# Patient Record
Sex: Male | Born: 1955
Health system: Southern US, Community
[De-identification: ages and names within clinical notes are randomized; demographics above are authoritative.]

## PROBLEM LIST (undated history)

## (undated) DIAGNOSIS — M1990 Unspecified osteoarthritis, unspecified site: Secondary | ICD-10-CM

## (undated) HISTORY — PX: WISDOM TOOTH EXTRACTION: SHX21

## (undated) HISTORY — PX: RETINAL LASER PROCEDURE: SHX2339

## (undated) HISTORY — PX: EYE SURGERY: SHX253

## (undated) HISTORY — PX: JOINT REPLACEMENT: SHX530

## (undated) HISTORY — DX: Unspecified osteoarthritis, unspecified site: M19.90

## (undated) HISTORY — PX: RETINAL DETACHMENT SURGERY: SHX105

## (undated) HISTORY — PX: CATARACT EXTRACTION, BILATERAL: SHX1313

---

## 2009-06-21 ENCOUNTER — Ambulatory Visit: Payer: Self-pay | Admitting: General Practice

## 2009-07-06 ENCOUNTER — Inpatient Hospital Stay: Payer: Self-pay | Admitting: General Practice

## 2010-06-22 ENCOUNTER — Ambulatory Visit: Payer: Self-pay | Admitting: General Practice

## 2010-06-29 ENCOUNTER — Inpatient Hospital Stay: Payer: Self-pay | Admitting: General Practice

## 2010-07-01 LAB — PATHOLOGY REPORT

## 2010-08-07 HISTORY — PX: TOTAL HIP ARTHROPLASTY: SHX124

## 2011-08-08 HISTORY — PX: TOTAL HIP ARTHROPLASTY: SHX124

## 2012-06-06 IMAGING — CR RIGHT HIP - COMPLETE 2+ VIEW
1 series · 2 of 2 positions shown · non-contrast
Comparison: none

REASON FOR EXAM: s/p THA
COMMENTS:   Bedside (portable):Y

PROCEDURE:     DXR - DXR HIP RIGHT COMPLETE  - June 29, 2010 [DATE]
RESULT:     The patient is status post right hip replacement. No fracture
about the prosthetic components is seen. There is no dislocation at the
prosthetic hip joint.

[Series 1: view not recorded · 0.17mm/px · 2 of 2 slices shown]
[im 1/2]
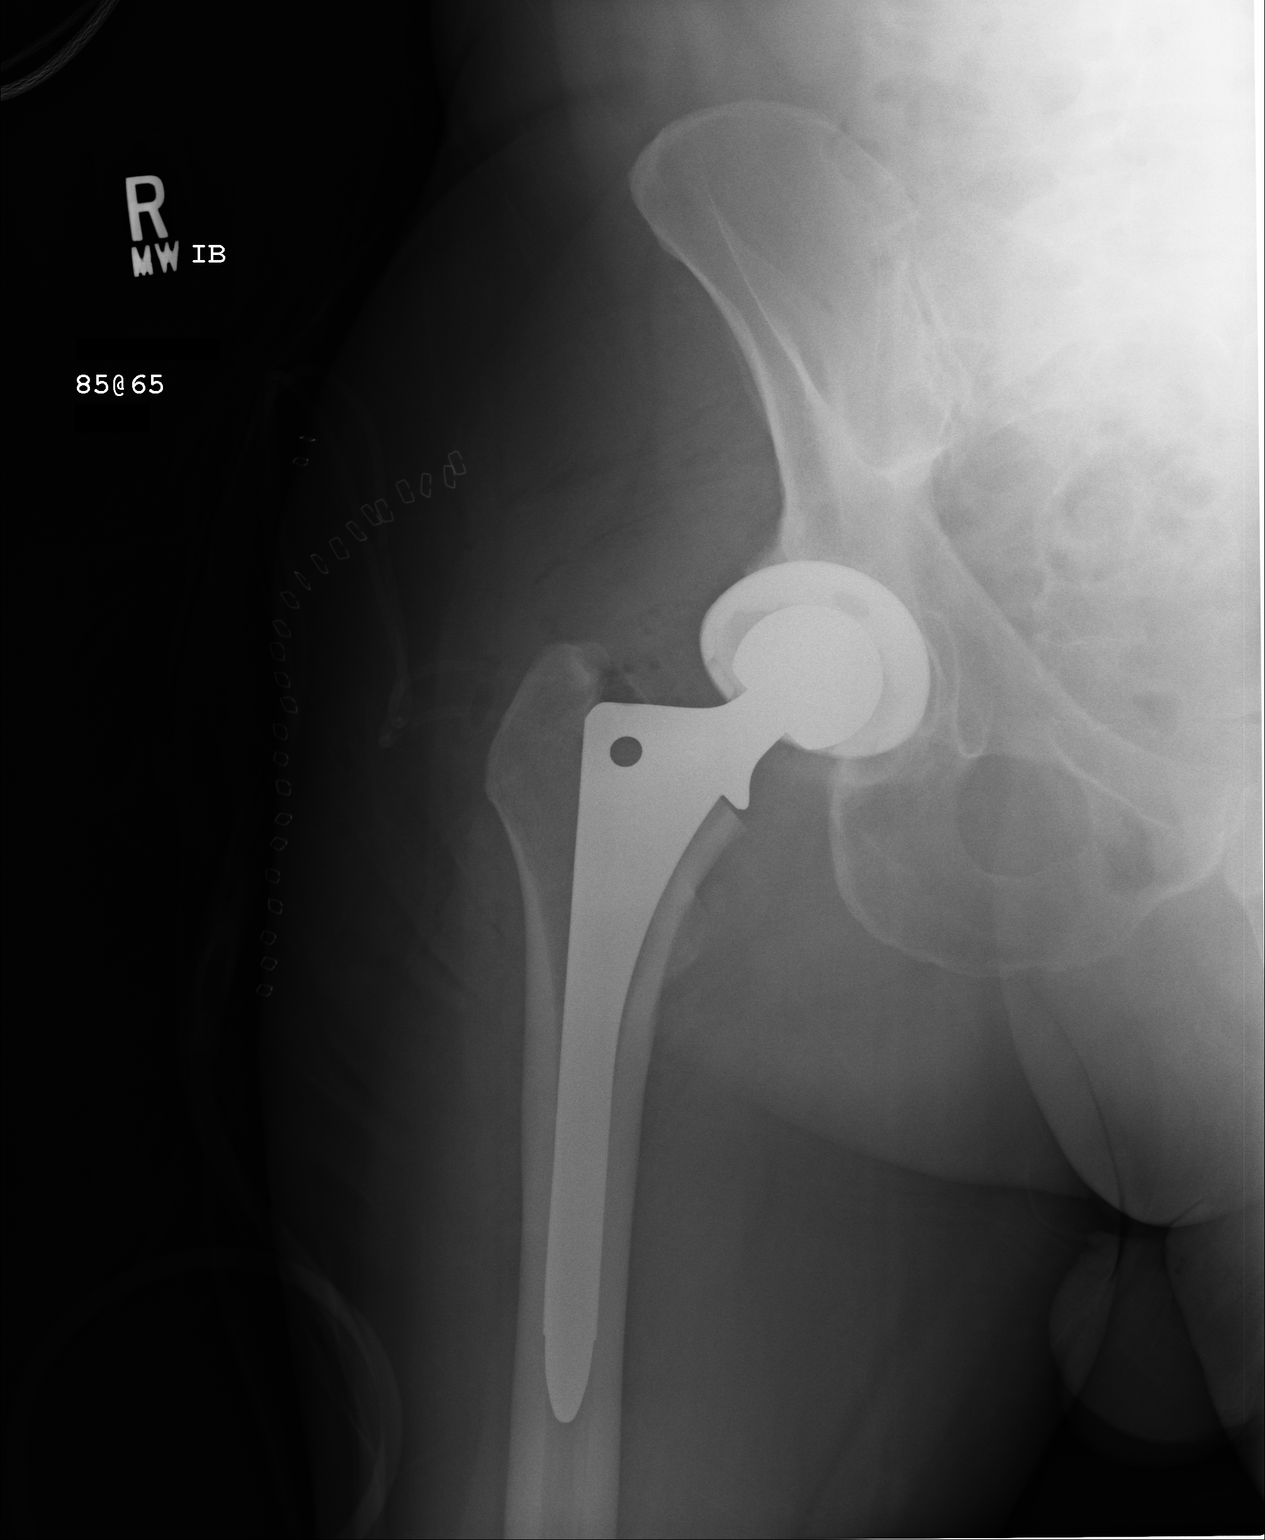
[im 2/2]
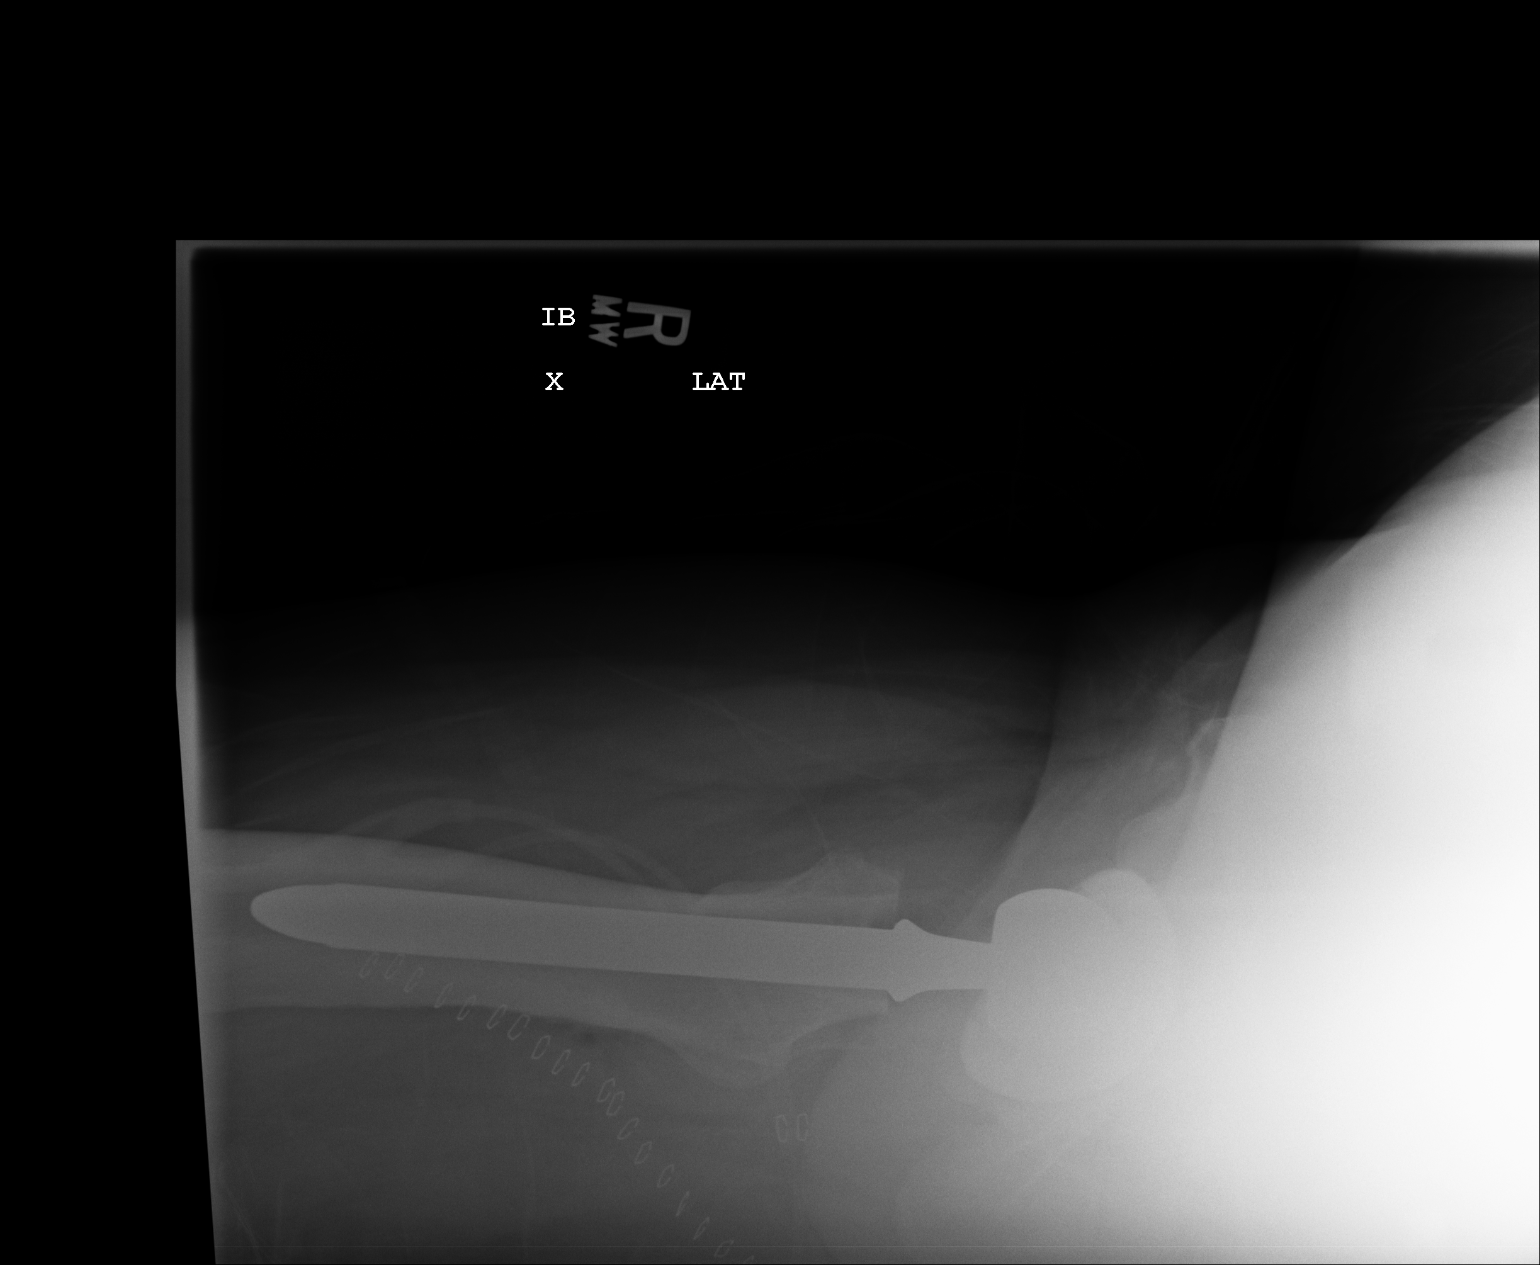

[2 of 2 positions shown; findings below may reference images not displayed]

IMPRESSION: 1.     The patient is status post right hip replacement. No abnormal
postoperative changes are identified.

## 2016-08-15 ENCOUNTER — Ambulatory Visit (INDEPENDENT_AMBULATORY_CARE_PROVIDER_SITE_OTHER): Payer: Managed Care, Other (non HMO) | Admitting: Primary Care

## 2016-08-15 ENCOUNTER — Encounter: Payer: Self-pay | Admitting: Primary Care

## 2016-08-15 VITALS — BP 122/84 | HR 83 | Temp 99.1°F | Ht 71.0 in | Wt 203.0 lb

## 2016-08-15 DIAGNOSIS — J069 Acute upper respiratory infection, unspecified: Secondary | ICD-10-CM

## 2016-08-15 DIAGNOSIS — Z7689 Persons encountering health services in other specified circumstances: Secondary | ICD-10-CM | POA: Insufficient documentation

## 2016-08-15 DIAGNOSIS — Z0000 Encounter for general adult medical examination without abnormal findings: Secondary | ICD-10-CM | POA: Insufficient documentation

## 2016-08-15 DIAGNOSIS — M1990 Unspecified osteoarthritis, unspecified site: Secondary | ICD-10-CM | POA: Diagnosis not present

## 2016-08-15 MED ORDER — AZITHROMYCIN 250 MG PO TABS: ORAL_TABLET | ORAL | 0 refills | Status: DC

## 2016-08-15 NOTE — Progress Notes (Signed)
Subjective:    Patient ID: Mark Walters, male    DOB: 1956-08-03, 61 y.o.   MRN: [id number]  URI   Associated symptoms include congestion and coughing. Pertinent negatives include no chest pain or sore throat.    Mark Walters is a 61 year old male who presents today to establish care and discuss the problems mentioned below. Will obtain old records.  1) Cough: Also with chest congestion, nasal congestion, headache. His symptoms began 9 days ago. He's taken Mucinex, Theraflu, Robitussin for his symptoms without much improvement. Overall he started feeling worse towards the end of last week. His cough is productive with green/yellow/dark sputum.    Review of Systems  Constitutional: Positive for fatigue and fever. Negative for chills.  HENT: Positive for congestion and sinus pressure. Negative for sore throat.   Respiratory: Positive for cough. Negative for shortness of breath.   Cardiovascular: Negative for chest pain.       Past Medical History:  Diagnosis Date  . Arthritis      Social History   Social History  . Marital status: Married    Spouse name: N/A  . Number of children: N/A  . Years of education: N/A   Occupational History  . Not on file.   Social History Main Topics  . Smoking status: Former Games developermoker  . Smokeless tobacco: Former NeurosurgeonUser  . Alcohol use Not on file  . Drug use: Unknown  . Sexual activity: Not on file   Other Topics Concern  . Not on file   Social History Narrative   Married.   2 children. 1 grandchild.   Works in [employer] as a Production designer, theatre/television/filmmanager.    Enjoys playing chess, walking his dog.    Past Surgical History:  Procedure Laterality Date  . TOTAL HIP ARTHROPLASTY Left 2012  . TOTAL HIP ARTHROPLASTY Right 2013    Family History  Problem Relation Age of Onset  . Heart disease Paternal Grandfather   . Alzheimer's disease Mother     No Known Allergies  No current outpatient prescriptions on file prior to visit.   No current  facility-administered medications on file prior to visit.     BP 122/84 (BP Location: Left Arm, Patient Position: Sitting, Cuff Size: Normal)   Pulse 83   Temp 99.1 F (37.3 C) (Oral)   Ht 5\' 11"  (1.803 m)   Wt 203 lb (92.1 kg)   SpO2 96%   BMI 28.31 kg/m    Objective:   Physical Exam  Constitutional: He appears well-nourished. He appears ill.  HENT:  Right Ear: Tympanic membrane and ear canal normal.  Left Ear: Ear canal normal. Tympanic membrane is bulging. Tympanic membrane is not erythematous.  Nose: Mucosal edema present. Right sinus exhibits no maxillary sinus tenderness and no frontal sinus tenderness. Left sinus exhibits no maxillary sinus tenderness and no frontal sinus tenderness.  Mouth/Throat: Oropharynx is clear and moist.  Eyes: Conjunctivae are normal.  Neck: Neck supple.  Cardiovascular: Normal rate and regular rhythm.   Pulmonary/Chest: Effort normal and breath sounds normal. He has no wheezes. He has no rales.  Hacking cough present during exam  Skin: Skin is warm and dry.          Assessment & Plan:  URI:  Cough, congestion, headache x 9 days. Overall starting to feel worse and now with productive cough. Exam today with hacking cough, does appear ill, clear lungs. Given duration of symptoms, examination, and presentation will treat. Rx for Zpak sent  to pharmacy. Discussed to continue mucinex, stay hydrated and rest. Follow up PRN.  Establish Care:  No significant PMH. Has not seen PCP in years. Will schedule physical in 2018.  Morrie Sheldon, NP

## 2016-08-15 NOTE — Patient Instructions (Signed)
Start Azithromycin antibiotics. Take 2 tablets by mouth today, then 1 tablet daily for 4 additional days.  Continue Mucinex and ensure you are staying hydrated with water.  Please schedule a physical with me within the next 6 months. You may also schedule a lab only appointment 3-4 days prior. We will discuss your lab results in detail during your physical.  It was a pleasure to meet you today! Please don't hesitate to call me with any questions. Welcome to [facility]!

## 2016-08-15 NOTE — Assessment & Plan Note (Signed)
To hips bilaterally, history of bilateral replacements. Overall manages well with OTC medications which he uses infrequently.

## 2016-08-15 NOTE — Progress Notes (Signed)
Pre visit review using our clinic review tool, if applicable. No additional management support is needed unless otherwise documented below in the visit note. 

## 2017-06-15 ENCOUNTER — Ambulatory Visit (INDEPENDENT_AMBULATORY_CARE_PROVIDER_SITE_OTHER): Payer: BLUE CROSS/BLUE SHIELD | Admitting: Internal Medicine

## 2017-06-15 ENCOUNTER — Encounter: Payer: Self-pay | Admitting: Internal Medicine

## 2017-06-15 VITALS — BP 110/74 | HR 61 | Temp 98.6°F | Wt 210.0 lb

## 2017-06-15 DIAGNOSIS — J0100 Acute maxillary sinusitis, unspecified: Secondary | ICD-10-CM | POA: Insufficient documentation

## 2017-06-15 MED ORDER — AMOXICILLIN 500 MG PO TABS: 1000.0000 mg | ORAL_TABLET | Freq: Two times a day (BID) | ORAL | 0 refills | Status: AC

## 2017-06-15 NOTE — Assessment & Plan Note (Signed)
Has been sick for 2 weeks Feels it in chest but nothing to suggest pulmonary infection Discussed analgesics. Consider humidifier Will treat with amoxil

## 2017-06-15 NOTE — Progress Notes (Signed)
   Subjective:    Patient ID: Mark Walters, male    DOB: March 31, 1956, 61 y.o.   MRN: [id number]  HPI Here due to respiratory infection  Started with head cold a couple of weeks ago Now into his chest for the past 10 days or so Sore throat last week No fever No chills or sweats No SOB  Various cold pills Not clear if helping Also uses chloraseptic  No current outpatient medications on file prior to visit.   No current facility-administered medications on file prior to visit.     No Known Allergies  Past Medical History:  Diagnosis Date  . Arthritis     Past Surgical History:  Procedure Laterality Date  . TOTAL HIP ARTHROPLASTY Left 2012  . TOTAL HIP ARTHROPLASTY Right 2013    Family History  Problem Relation Age of Onset  . Heart disease Paternal Grandfather   . Alzheimer's disease Mother     Social History   Socioeconomic History  . Marital status: Married    Spouse name: Not on file  . Number of children: Not on file  . Years of education: Not on file  . Highest education level: Not on file  Social Needs  . Financial resource strain: Not on file  . Food insecurity - worry: Not on file  . Food insecurity - inability: Not on file  . Transportation needs - medical: Not on file  . Transportation needs - non-medical: Not on file  Occupational History  . Not on file  Tobacco Use  . Smoking status: Former Games developermoker  . Smokeless tobacco: Former Engineer, waterUser  Substance and Sexual Activity  . Alcohol use: Not on file  . Drug use: Not on file  . Sexual activity: Not on file  Other Topics Concern  . Not on file  Social History Narrative   Married.   2 children. 1 grandchild.   Works in [employer] as a Production designer, theatre/television/filmmanager.    Enjoys playing chess, walking his dog.   Review of Systems Some headache around eyes--especially with cough (worse at night) Some left ear pain at the beginning --gone now No rash No vomiting Loose stools at first--not bad. Not more frequent Appetite is  good    Objective:   Physical Exam  Constitutional: No distress.  HENT:  Mouth/Throat: Oropharynx is clear and moist. No oropharyngeal exudate.  Mild maxillary tenderness Moderate nasal inflammation, esp on left TMs normal  Neck: No thyromegaly present.  Pulmonary/Chest: Effort normal and breath sounds normal. No respiratory distress. He has no wheezes. He has no rales.  Musculoskeletal: He exhibits no edema.  Lymphadenopathy:    He has no cervical adenopathy.          Assessment & Plan:

## 2019-11-06 ENCOUNTER — Ambulatory Visit: Payer: BLUE CROSS/BLUE SHIELD

## 2020-08-16 DIAGNOSIS — H2512 Age-related nuclear cataract, left eye: Secondary | ICD-10-CM | POA: Diagnosis not present

## 2020-08-17 DIAGNOSIS — H2511 Age-related nuclear cataract, right eye: Secondary | ICD-10-CM | POA: Diagnosis not present

## 2020-09-06 DIAGNOSIS — H2511 Age-related nuclear cataract, right eye: Secondary | ICD-10-CM | POA: Diagnosis not present

## 2020-09-07 ENCOUNTER — Ambulatory Visit: Payer: Self-pay | Admitting: Internal Medicine

## 2020-09-22 ENCOUNTER — Other Ambulatory Visit: Payer: Self-pay

## 2020-09-23 ENCOUNTER — Other Ambulatory Visit: Payer: Self-pay

## 2020-09-23 ENCOUNTER — Ambulatory Visit (INDEPENDENT_AMBULATORY_CARE_PROVIDER_SITE_OTHER): Payer: Medicare Other | Admitting: Internal Medicine

## 2020-09-23 ENCOUNTER — Encounter: Payer: Self-pay | Admitting: Internal Medicine

## 2020-09-23 VITALS — BP 116/70 | HR 67 | Temp 98.2°F | Wt 198.0 lb

## 2020-09-23 DIAGNOSIS — M1990 Unspecified osteoarthritis, unspecified site: Secondary | ICD-10-CM

## 2020-09-23 DIAGNOSIS — Z1159 Encounter for screening for other viral diseases: Secondary | ICD-10-CM

## 2020-09-23 DIAGNOSIS — Z0000 Encounter for general adult medical examination without abnormal findings: Secondary | ICD-10-CM

## 2020-09-23 DIAGNOSIS — Z125 Encounter for screening for malignant neoplasm of prostate: Secondary | ICD-10-CM | POA: Diagnosis not present

## 2020-09-23 DIAGNOSIS — Z114 Encounter for screening for human immunodeficiency virus [HIV]: Secondary | ICD-10-CM | POA: Diagnosis not present

## 2020-09-23 DIAGNOSIS — Z1211 Encounter for screening for malignant neoplasm of colon: Secondary | ICD-10-CM

## 2020-09-23 NOTE — Progress Notes (Signed)
HPI  Pt presents to the clinic today to establish care and for management of the conditions listed below. He would also like his Welcome to Medicare Exam today.  OA: Mainly in his hips s/p b/l hip replacements, knees. He takes Ibuprofen as needed with some relief.   Past Medical History:  Diagnosis Date  . Arthritis     Current Outpatient Medications  Medication Sig Dispense Refill  . Ascorbic Acid (VITAMIN C) 1000 MG tablet Take 1,000 mg by mouth daily.    Marland Kitchen BESIVANCE 0.6 % SUSP Place 1 drop into the right eye 3 (three) times daily.    . brimonidine (ALPHAGAN P) 0.1 % SOLN     . Cholecalciferol (VITAMIN D3) 50 MCG (2000 UT) TABS Take by mouth.    . Difluprednate 0.05 % EMUL INSTILL 1 DROP IN RIGHT EYE THREE TIMES DAILY AS DIRECTED    . ibuprofen (ADVIL) 200 MG tablet Take 200 mg by mouth every 6 (six) hours as needed.    Marland Kitchen PROLENSA 0.07 % SOLN SMARTSIG:1 Drop(s) Right Eye Every Evening     No current facility-administered medications for this visit.    No Known Allergies  Family History  Problem Relation Age of Onset  . Heart disease Paternal Grandfather   . Hyperlipidemia Paternal Grandfather   . Hypertension Paternal Grandfather   . Alzheimer's disease Mother   . Asthma Father   . Asthma Sister     Social History   Socioeconomic History  . Marital status: Married    Spouse name: Not on file  . Number of children: Not on file  . Years of education: Not on file  . Highest education level: Not on file  Occupational History  . Not on file  Tobacco Use  . Smoking status: Former Smoker    Types: Cigars  . Smokeless tobacco: Former Network engineer and Sexual Activity  . Alcohol use: Yes    Comment: occasional, EOD  . Drug use: Never  . Sexual activity: Not on file  Other Topics Concern  . Not on file  Social History Narrative   Married.   2 children. 1 grandchild.   Works in [employer] as a Freight forwarder.    Enjoys playing chess, walking his dog.   Social Determinants  of Health   Financial Resource Strain: Not on file  Food Insecurity: Not on file  Transportation Needs: Not on file  Physical Activity: Not on file  Stress: Not on file  Social Connections: Not on file  Intimate Partner Violence: Not on file    Hospitiliaztions: None  Health Maintenance:    Flu: Never  Tetanus: >10 years ago  Covid: J&J, Pfizer x1  PSA Screening: Never   Colon Screening: Never  Vision Screening: Annually  Dentist: Biannually   Providers:   KJZ:PHXTAV Spencer Cardinal, NP   I have personally reviewed and have noted:  1. The patient's medical and social history 2. Their use of alcohol, tobacco or illicit drugs 3. Their current medications and supplements 4. The patient's functional ability including ADL's, fall risks, home safety risks and hearing or visual impairment. 5. Diet and physical activities 6. Evidence for depression or mood disorder  Subjective:   Review of Systems:   Constitutional: Denies fever, malaise, fatigue, headache or abrupt weight changes.  HEENT: Denies eye pain, eye redness, ear pain, ringing in the ears, wax buildup, runny nose, nasal congestion, bloody nose, or sore throat. Respiratory: Denies difficulty breathing, shortness of breath, cough or sputum production.  Cardiovascular: Denies chest pain, chest tightness, palpitations or swelling in the hands or feet.  Gastrointestinal: Denies abdominal pain, bloating, constipation, diarrhea or blood in the stool.  GU: Denies urgency, frequency, pain with urination, burning sensation, blood in urine, odor or discharge. Musculoskeletal: Patient reports intermittent knee pain.  Denies decrease in range of motion, difficulty with gait, muscle pain or joint swelling.  Skin: Denies redness, rashes, lesions or ulcercations.  Neurological: Denies dizziness, difficulty with memory, difficulty with speech or problems with balance and coordination.  Psych: Denies anxiety, depression, SI/HI.  No other  specific complaints in a complete review of systems (except as listed in HPI above).  Objective:  PE:   BP 116/70   Pulse 67   Temp 98.2 F (36.8 C) (Temporal)   Wt 89.8 kg   SpO2 97%   BMI 27.62 kg/m  Wt Readings from Last 3 Encounters:  09/23/20 89.8 kg  06/15/17 95.3 kg  08/15/16 92.1 kg    General: Appears his stated age, well developed, well nourished in NAD. Skin: Warm, dry and intact. No rashes noted. HEENT: Head: normal shape and size; Eyes: sclera white, no icterus, conjunctiva pink, PERRLA and EOMs intact;  Neck: Neck supple, trachea midline. No masses, lumps or thyromegaly present.  Cardiovascular: Normal rate and rhythm. S1,S2 noted.  No murmur, rubs or gallops noted. No JVD or BLE edema. No carotid bruits noted. Pulmonary/Chest: Normal effort and positive vesicular breath sounds. No respiratory distress. No wheezes, rales or ronchi noted.  Abdomen: Soft and nontender. Normal bowel sounds. No distention or masses noted. Liver, spleen and kidneys non palpable. Musculoskeletal: Strength 5/5 BUE/BLE. No signs of joint swelling.  Neurological: Alert and oriented. Cranial nerves II-XII grossly intact. Coordination normal.  Psychiatric: Mood and affect normal. Behavior is normal. Judgment and thought content normal.   ECG: incomplete right bundle branch block  BMET    Component Value Date/Time   NA 137 09/23/2020 1520   K 4.5 09/23/2020 1520   CL 103 09/23/2020 1520   CO2 29 09/23/2020 1520   GLUCOSE 97 09/23/2020 1520   BUN 17 09/23/2020 1520   CREATININE 1.02 09/23/2020 1520   CALCIUM 9.2 09/23/2020 1520    Lipid Panel     Component Value Date/Time   CHOL 193 09/23/2020 1520   TRIG 89.0 09/23/2020 1520   HDL 75.30 09/23/2020 1520   CHOLHDL 3 09/23/2020 1520   VLDL 17.8 09/23/2020 1520   LDLCALC 100 (H) 09/23/2020 1520    CBC    Component Value Date/Time   WBC 8.1 09/23/2020 1520   RBC 4.25 09/23/2020 1520   HGB 13.8 09/23/2020 1520   HCT 40.9  09/23/2020 1520   PLT 245.0 09/23/2020 1520   MCV 96.2 09/23/2020 1520   MCHC 33.8 09/23/2020 1520   RDW 13.0 09/23/2020 1520    Hgb A1C No results found for: HGBA1C    Assessment and Plan:   Medicare Annual Wellness Visit:  Diet: He does eat meat.  He consumes fruits and vegetables.  He does eat fried foods Physical activity: Walking Depression/mood screen: Negative, PHQ 9 score of 0 Hearing: Intact to whispered voice Visual acuity: Grossly normal, performs annual eye exam  ADLs: Capable Fall risk: None Home safety: Good Cognitive evaluation: Intact to orientation, naming, recall and repetition EOL planning: No adv directives, full code/ I agree  Preventative Medicine: He declines flu, tetanus, Pneumovax, Prevnar today.  Encouraged him to get his COVID booster.  Zostavax UTD but he declines Shingrix at  this time.  PSA today with labs.  He declines referral to GI for colonoscopy but Cologuard ordered.  Encouraged him to consume a balanced diet and exercise regimen.  Advised him to see an eye doctor and dentist annually.  Will check CBC, c-Met, lipid, PSA, HIV and hep C today.  Due dates for screening exam given the patient as part of his AVS.   Next appointment: 1 year Medicare wellness exam.    Webb Silversmith, NP This visit occurred during the SARS-CoV-2 public health emergency.  Safety protocols were in place, including screening questions prior to the visit, additional usage of staff PPE, and extensive cleaning of exam room while observing appropriate contact time as indicated for disinfecting solutions.

## 2020-09-24 LAB — LIPID PANEL
Cholesterol: 193 mg/dL (ref 0–200)
HDL: 75.3 mg/dL (ref >39.00)
LDL Cholesterol: 100 mg/dL — ABNORMAL HIGH (ref 0–99)
NonHDL: 118.1
Total CHOL/HDL Ratio: 3
Triglycerides: 89 mg/dL (ref 0.0–149.0)
VLDL: 17.8 mg/dL (ref 0.0–40.0)

## 2020-09-24 LAB — HEPATITIS C ANTIBODY
Hepatitis C Ab: NONREACTIVE
SIGNAL TO CUT-OFF: 0.02 (ref <1.00)

## 2020-09-24 LAB — PSA, MEDICARE: PSA: 2.11 ng/ml (ref 0.10–4.00)

## 2020-09-24 LAB — COMPREHENSIVE METABOLIC PANEL
ALT: 23 U/L (ref 0–53)
AST: 19 U/L (ref 0–37)
Albumin: 4.1 g/dL (ref 3.5–5.2)
Alkaline Phosphatase: 39 U/L (ref 39–117)
BUN: 17 mg/dL (ref 6–23)
CO2: 29 mEq/L (ref 19–32)
Calcium: 9.2 mg/dL (ref 8.4–10.5)
Chloride: 103 mEq/L (ref 96–112)
Creatinine, Ser: 1.02 mg/dL (ref 0.40–1.50)
GFR: 77.37 mL/min (ref >60.00)
Glucose, Bld: 97 mg/dL (ref 70–99)
Potassium: 4.5 mEq/L (ref 3.5–5.1)
Sodium: 137 mEq/L (ref 135–145)
Total Bilirubin: 0.5 mg/dL (ref 0.2–1.2)
Total Protein: 6.5 g/dL (ref 6.0–8.3)

## 2020-09-24 LAB — CBC
HCT: 40.9 % (ref 39.0–52.0)
Hemoglobin: 13.8 g/dL (ref 13.0–17.0)
MCHC: 33.8 g/dL (ref 30.0–36.0)
MCV: 96.2 fl (ref 78.0–100.0)
Platelets: 245 10*3/uL (ref 150.0–400.0)
RBC: 4.25 Mil/uL (ref 4.22–5.81)
RDW: 13 % (ref 11.5–15.5)
WBC: 8.1 10*3/uL (ref 4.0–10.5)

## 2020-09-24 LAB — HIV ANTIBODY (ROUTINE TESTING W REFLEX): HIV 1&2 Ab, 4th Generation: NONREACTIVE

## 2020-09-27 NOTE — Patient Instructions (Signed)

## 2020-09-27 NOTE — Assessment & Plan Note (Signed)
Mainly in hips and knees Continue Ibuprofen OTC

## 2020-10-25 DIAGNOSIS — Z1211 Encounter for screening for malignant neoplasm of colon: Secondary | ICD-10-CM | POA: Diagnosis not present

## 2020-10-29 LAB — EXTERNAL GENERIC LAB PROCEDURE: COLOGUARD: NEGATIVE

## 2020-10-29 LAB — COLOGUARD: COLOGUARD: NEGATIVE

## 2020-11-01 ENCOUNTER — Encounter: Payer: Self-pay | Admitting: Internal Medicine

## 2020-11-01 ENCOUNTER — Ambulatory Visit (INDEPENDENT_AMBULATORY_CARE_PROVIDER_SITE_OTHER): Payer: Medicare Other | Admitting: Internal Medicine

## 2020-11-01 ENCOUNTER — Other Ambulatory Visit: Payer: Self-pay

## 2020-11-01 VITALS — BP 118/76 | HR 63 | Temp 97.5°F | Wt 198.0 lb

## 2020-11-01 DIAGNOSIS — L02212 Cutaneous abscess of back [any part, except buttock]: Secondary | ICD-10-CM

## 2020-11-01 MED ORDER — SULFAMETHOXAZOLE-TRIMETHOPRIM 800-160 MG PO TABS: 1.0000 | ORAL_TABLET | Freq: Two times a day (BID) | ORAL | 0 refills | Status: DC

## 2020-11-01 NOTE — Progress Notes (Signed)
Subjective:    Patient ID: Mark Walters, male    DOB: September 16, 1955, 65 y.o.   MRN: [id number]  HPI  Pt presents to the clinic today with c/o swelling and discharge from a lump on his upper back. He noticed this about 1 week ago. He reports the swelling has decreased but he still has discharge. He has washed the area with peroxide and took 4 Amoxicillin tablets that he has to take prior to dental procedures. He denies fever, chills, nausea or vomiting.  Review of Systems      Past Medical History:  Diagnosis Date  . Arthritis     Current Outpatient Medications  Medication Sig Dispense Refill  . Ascorbic Acid (VITAMIN C) 1000 MG tablet Take 1,000 mg by mouth daily.    Marland Kitchen BESIVANCE 0.6 % SUSP Place 1 drop into the right eye 3 (three) times daily.    . brimonidine (ALPHAGAN P) 0.1 % SOLN     . Cholecalciferol (VITAMIN D3) 50 MCG (2000 UT) TABS Take by mouth.    . Difluprednate 0.05 % EMUL INSTILL 1 DROP IN RIGHT EYE THREE TIMES DAILY AS DIRECTED    . ibuprofen (ADVIL) 200 MG tablet Take 200 mg by mouth every 6 (six) hours as needed.    Marland Kitchen PROLENSA 0.07 % SOLN SMARTSIG:1 Drop(s) Right Eye Every Evening     No current facility-administered medications for this visit.    No Known Allergies  Family History  Problem Relation Age of Onset  . Heart disease Paternal Grandfather   . Hyperlipidemia Paternal Grandfather   . Hypertension Paternal Grandfather   . Alzheimer's disease Mother   . Asthma Father   . Asthma Sister     Social History   Socioeconomic History  . Marital status: Married    Spouse name: Not on file  . Number of children: Not on file  . Years of education: Not on file  . Highest education level: Not on file  Occupational History  . Not on file  Tobacco Use  . Smoking status: Former Smoker    Types: Cigars  . Smokeless tobacco: Former Engineer, water and Sexual Activity  . Alcohol use: Yes    Comment: occasional, EOD  . Drug use: Never  . Sexual  activity: Not on file  Other Topics Concern  . Not on file  Social History Narrative   Married.   2 children. 1 grandchild.   Works in [employer] as a Production designer, theatre/television/film.    Enjoys playing chess, walking his dog.   Social Determinants of Health   Financial Resource Strain: Not on file  Food Insecurity: Not on file  Transportation Needs: Not on file  Physical Activity: Not on file  Stress: Not on file  Social Connections: Not on file  Intimate Partner Violence: Not on file     Constitutional: Denies fever, malaise, fatigue, headache or abrupt weight changes.  Respiratory: Denies difficulty breathing, shortness of breath, cough or sputum production.   Cardiovascular: Denies chest pain, chest tightness, palpitations or swelling in the hands or feet.   Skin: Pt reports abscess of back. Denies redness, rashes or ulcercations.   No other specific complaints in a complete review of systems (except as listed in HPI above).  Objective:   Physical Exam  BP 118/76   Pulse 63   Temp (!) 97.5 F (36.4 C) (Temporal)   Wt 198 lb (89.8 kg)   SpO2 98%   BMI 27.62 kg/m    Wt  Readings from Last 3 Encounters:  09/23/20 198 lb (89.8 kg)  06/15/17 210 lb (95.3 kg)  08/15/16 203 lb (92.1 kg)    General: Appears his stated age, well developed, well nourished in NAD. Skin: 2 cm abscess noted of central upper back, in between the shoulder blades. No surrounding cellulitis. Cardiovascular: Normal rate. Pulmonary/Chest: Normal effort. Neurological: Alert and oriented.   BMET    Component Value Date/Time   NA 137 09/23/2020 1520   K 4.5 09/23/2020 1520   CL 103 09/23/2020 1520   CO2 29 09/23/2020 1520   GLUCOSE 97 09/23/2020 1520   BUN 17 09/23/2020 1520   CREATININE 1.02 09/23/2020 1520   CALCIUM 9.2 09/23/2020 1520    Lipid Panel     Component Value Date/Time   CHOL 193 09/23/2020 1520   TRIG 89.0 09/23/2020 1520   HDL 75.30 09/23/2020 1520   CHOLHDL 3 09/23/2020 1520   VLDL 17.8  09/23/2020 1520   LDLCALC 100 (H) 09/23/2020 1520    CBC    Component Value Date/Time   WBC 8.1 09/23/2020 1520   RBC 4.25 09/23/2020 1520   HGB 13.8 09/23/2020 1520   HCT 40.9 09/23/2020 1520   PLT 245.0 09/23/2020 1520   MCV 96.2 09/23/2020 1520   MCHC 33.8 09/23/2020 1520   RDW 13.0 09/23/2020 1520    Hgb A1C No results found for: HGBA1C        Assessment & Plan:   Abscess of Back:  I&D performed, see procedure noted RX for Septra DS 1 tab PO BID x 10 days  Procedure Note:  Discussed risk and benefits of procedure Informed consent obtained verbally Area cleansed with Betadine x 3 Area numbnes with 1% Lidocaine with Epi Area incised with #11 blade Small amount of thick, chunky white matter expressed Area cleanses with NS Covered with TAB and bandaid Pt tolerated well, no complications  RTC in 5 days for recheck Nicki Reaper, NP This visit occurred during the SARS-CoV-2 public health emergency.  Safety protocols were in place, including screening questions prior to the visit, additional usage of staff PPE, and extensive cleaning of exam room while observing appropriate contact time as indicated for disinfecting solutions.

## 2020-11-01 NOTE — Patient Instructions (Signed)

## 2020-11-05 ENCOUNTER — Ambulatory Visit (INDEPENDENT_AMBULATORY_CARE_PROVIDER_SITE_OTHER): Payer: Medicare Other | Admitting: Internal Medicine

## 2020-11-05 ENCOUNTER — Ambulatory Visit: Payer: Medicare Other | Admitting: Internal Medicine

## 2020-11-05 ENCOUNTER — Other Ambulatory Visit: Payer: Self-pay

## 2020-11-05 ENCOUNTER — Encounter: Payer: Self-pay | Admitting: Internal Medicine

## 2020-11-05 VITALS — BP 116/72 | HR 68 | Temp 97.3°F | Wt 192.0 lb

## 2020-11-05 DIAGNOSIS — L02212 Cutaneous abscess of back [any part, except buttock]: Secondary | ICD-10-CM

## 2020-11-05 NOTE — Patient Instructions (Signed)

## 2020-11-05 NOTE — Progress Notes (Signed)
Subjective:    Patient ID: Mark Walters, male    DOB: [birth year], 65 y.o.   MRN: [id number]  HPI  Pt presents to the clinic today for follow up abscess. He was seen for the same 3/28. I&D was performed and he was started on Septra DS. He reports the area is still draining but it does look better than prior. He denies fever, chills or body aches.  Review of Systems      Past Medical History:  Diagnosis Date  . Arthritis     Current Outpatient Medications  Medication Sig Dispense Refill  . Ascorbic Acid (VITAMIN C) 1000 MG tablet Take 1,000 mg by mouth daily.    . Cholecalciferol (VITAMIN D3) 50 MCG (2000 UT) TABS Take by mouth.    Marland Kitchen glucosamine-chondroitin 500-400 MG tablet Take 2 tablets by mouth daily.    Marland Kitchen ibuprofen (ADVIL) 200 MG tablet Take 200 mg by mouth every 6 (six) hours as needed.    . sulfamethoxazole-trimethoprim (BACTRIM DS) 800-160 MG tablet Take 1 tablet by mouth 2 (two) times daily. 20 tablet 0   No current facility-administered medications for this visit.    No Known Allergies  Family History  Problem Relation Age of Onset  . Heart disease Paternal Grandfather   . Hyperlipidemia Paternal Grandfather   . Hypertension Paternal Grandfather   . Alzheimer's disease Mother   . Asthma Father   . Asthma Sister     Social History   Socioeconomic History  . Marital status: Married    Spouse name: Not on file  . Number of children: Not on file  . Years of education: Not on file  . Highest education level: Not on file  Occupational History  . Not on file  Tobacco Use  . Smoking status: Former Smoker    Types: Cigars  . Smokeless tobacco: Former Engineer, water and Sexual Activity  . Alcohol use: Yes    Comment: occasional, EOD  . Drug use: Never  . Sexual activity: Not on file  Other Topics Concern  . Not on file  Social History Narrative   Married.   2 children. 1 grandchild.   Works in [employer] as a Production designer, theatre/television/film.    Enjoys playing chess, walking his  dog.   Social Determinants of Health   Financial Resource Strain: Not on file  Food Insecurity: Not on file  Transportation Needs: Not on file  Physical Activity: Not on file  Stress: Not on file  Social Connections: Not on file  Intimate Partner Violence: Not on file     Constitutional: Denies fever, malaise, fatigue, headache or abrupt weight changes.  Respiratory: Denies difficulty breathing, shortness of breath, cough or sputum production.   Cardiovascular: Denies chest pain, chest tightness, palpitations or swelling in the hands or feet.  Skin: Pt reports draining abscess of back. Denies redness, rashes, or ulcercations.    No other specific complaints in a complete review of systems (except as listed in HPI above).  Objective:   Physical Exam   BP 116/72   Pulse 68   Temp (!) 97.3 F (36.3 C) (Temporal)   Wt 192 lb (87.1 kg)   SpO2 98%   BMI 26.78 kg/m  Wt Readings from Last 3 Encounters:  11/05/20 192 lb (87.1 kg)  11/01/20 198 lb (89.8 kg)  09/23/20 198 lb (89.8 kg)    General: Appears their stated age, well developed, well nourished in NAD. Skin: 1.5 cm open abscess noted of midline  back. Able to express chunky white tissue and scant amount of blood.  Cardiovascular: Normal rate. Pulmonary/Chest: Normal effort. Neurological: Alert and oriented.   BMET    Component Value Date/Time   NA 137 09/23/2020 1520   K 4.5 09/23/2020 1520   CL 103 09/23/2020 1520   CO2 29 09/23/2020 1520   GLUCOSE 97 09/23/2020 1520   BUN 17 09/23/2020 1520   CREATININE 1.02 09/23/2020 1520   CALCIUM 9.2 09/23/2020 1520    Lipid Panel     Component Value Date/Time   CHOL 193 09/23/2020 1520   TRIG 89.0 09/23/2020 1520   HDL 75.30 09/23/2020 1520   CHOLHDL 3 09/23/2020 1520   VLDL 17.8 09/23/2020 1520   LDLCALC 100 (H) 09/23/2020 1520    CBC    Component Value Date/Time   WBC 8.1 09/23/2020 1520   RBC 4.25 09/23/2020 1520   HGB 13.8 09/23/2020 1520   HCT 40.9  09/23/2020 1520   PLT 245.0 09/23/2020 1520   MCV 96.2 09/23/2020 1520   MCHC 33.8 09/23/2020 1520   RDW 13.0 09/23/2020 1520    Hgb A1C No results found for: HGBA1C         Assessment & Plan:   Abscess of Back:  Improving Continue Septra DS for an additional 5 days Encouraged warm compresses  Return precautions discussed Nicki Reaper, NP This visit occurred during the SARS-CoV-2 public health emergency.  Safety protocols were in place, including screening questions prior to the visit, additional usage of staff PPE, and extensive cleaning of exam room while observing appropriate contact time as indicated for disinfecting solutions.

## 2020-11-09 ENCOUNTER — Telehealth: Payer: Self-pay

## 2020-11-09 MED ORDER — SULFAMETHOXAZOLE-TRIMETHOPRIM 800-160 MG PO TABS: 1.0000 | ORAL_TABLET | Freq: Two times a day (BID) | ORAL | 0 refills | Status: DC

## 2020-11-09 NOTE — Telephone Encounter (Signed)
Will extend an additional 7 days, RX sent to pharmacy

## 2020-11-09 NOTE — Telephone Encounter (Signed)
Pt left v/m that pt was seen 11/05/20 with FU abscess of back; pt said the area of infection is better but still some swelling and when pt changes the dressing twice a day there is still puss on the dressing. Pt wonders if can extend taking the abx. Pt request cb after reviewed by Pamala Hurry NP. [facility] [address].

## 2020-11-09 NOTE — Telephone Encounter (Signed)
Left detailed msg on VM per HIPAA  

## 2021-05-02 DIAGNOSIS — Z961 Presence of intraocular lens: Secondary | ICD-10-CM | POA: Diagnosis not present

## 2022-03-27 ENCOUNTER — Encounter: Payer: Self-pay | Admitting: Internal Medicine

## 2022-03-27 ENCOUNTER — Ambulatory Visit (INDEPENDENT_AMBULATORY_CARE_PROVIDER_SITE_OTHER): Payer: Medicare Other | Admitting: Internal Medicine

## 2022-03-27 VITALS — BP 128/80 | HR 66 | Temp 98.7°F | Ht 71.0 in | Wt 199.0 lb

## 2022-03-27 DIAGNOSIS — M1990 Unspecified osteoarthritis, unspecified site: Secondary | ICD-10-CM | POA: Diagnosis not present

## 2022-03-27 DIAGNOSIS — Z0000 Encounter for general adult medical examination without abnormal findings: Secondary | ICD-10-CM

## 2022-03-27 DIAGNOSIS — Z23 Encounter for immunization: Secondary | ICD-10-CM

## 2022-03-27 NOTE — Assessment & Plan Note (Signed)
Improving with glucosamine. Given neck stretches to help.

## 2022-03-27 NOTE — Patient Instructions (Signed)
Think about the shingles vaccine and the tetanus at the pharmacy.

## 2022-03-27 NOTE — Assessment & Plan Note (Signed)
Flu shot yearly. Covid-19 counseled. Pneumonia 20 given today. Shingrix counseled to get at pharmacy. Tetanus counseled to get at pharmacy. Cologuard up to date. Counseled about sun safety and mole surveillance. Counseled about the dangers of distracted driving. Given 10 year screening recommendations.

## 2022-03-27 NOTE — Progress Notes (Signed)
Subjective:   Patient ID: Mark Walters, male    DOB: Apr 30, 1956, 66 y.o.   MRN: [id number]  HPI TOC coming in for medicare wellness and physical, no new complaints. Please see A/P for status and treatment of chronic medical problems.   Diet: heart healthy Physical activity: sedentary, active tennis Depression/mood screen: negative Hearing: intact to whispered voice Visual acuity: grossly normal, s/p bilateral cataracts, performs annual eye exam  ADLs: capable Fall risk: none Home safety: good Cognitive evaluation: intact to orientation, naming, recall and repetition EOL planning: adv directives discussed, not in place  [employer] Visit from 03/27/2022 in [facility]  PHQ-2 Total Score 0           09/23/2020    2:52 PM 03/27/2022    9:40 AM  Fall Risk  Falls in the past year? 0 0  Was there an injury with Fall?  0  Fall Risk Category Calculator  0  Fall Risk Category  Low  Patient Fall Risk Level  Low fall risk  Patient at Risk for Falls Due to  No Fall Risks  Fall risk Follow up  Falls evaluation completed    I have personally reviewed and have noted 1. The patient's medical and social history - reviewed today no changes 2. Their use of alcohol, tobacco or illicit drugs 3. Their current medications and supplements 4. The patient's functional ability including ADL's, fall risks, home safety risks and hearing or visual impairment. 5. Diet and physical activities 6. Evidence for depression or mood disorders 7. Care team reviewed and updated 8.  The patient is not on an opioid pain medication.  Patient Care Team: Myrlene Broker, MD as PCP - General (Internal Medicine) Past Medical History:  Diagnosis Date   Arthritis    Past Surgical History:  Procedure Laterality Date   CATARACT EXTRACTION, BILATERAL     RETINAL DETACHMENT SURGERY Left    RETINAL LASER PROCEDURE Left    TOTAL HIP ARTHROPLASTY Left 2012   TOTAL HIP  ARTHROPLASTY Right 2013   WISDOM TOOTH EXTRACTION     Family History  Problem Relation Age of Onset   Heart disease Paternal Grandfather    Hyperlipidemia Paternal Grandfather    Hypertension Paternal Grandfather    Alzheimer's disease Mother    Asthma Father    Asthma Sister    Review of Systems  Constitutional: Negative.   HENT: Negative.    Eyes: Negative.   Respiratory:  Negative for cough, chest tightness and shortness of breath.   Cardiovascular:  Negative for chest pain, palpitations and leg swelling.  Gastrointestinal:  Negative for abdominal distention, abdominal pain, constipation, diarrhea, nausea and vomiting.  Musculoskeletal: Negative.   Skin: Negative.   Neurological: Negative.   Psychiatric/Behavioral: Negative.      Objective:  Physical Exam Constitutional:      Appearance: He is well-developed.  HENT:     Head: Normocephalic and atraumatic.  Cardiovascular:     Rate and Rhythm: Normal rate and regular rhythm.  Pulmonary:     Effort: Pulmonary effort is normal. No respiratory distress.     Breath sounds: Normal breath sounds. No wheezing or rales.  Abdominal:     General: Bowel sounds are normal. There is no distension.     Palpations: Abdomen is soft.     Tenderness: There is no abdominal tenderness. There is no rebound.  Musculoskeletal:     Cervical back: Normal range of motion.  Skin:  General: Skin is warm and dry.  Neurological:     Mental Status: He is alert and oriented to person, place, and time.     Coordination: Coordination normal.     Vitals:   03/27/22 0937  BP: 128/80  Pulse: 66  Temp: 98.7 F (37.1 C)  TempSrc: Oral  SpO2: 99%  Weight: 199 lb (90.3 kg)  Height: 5\' 11"  (1.803 m)    Assessment & Plan:  Prevnar 20 given at visit

## 2022-03-28 ENCOUNTER — Ambulatory Visit: Payer: Medicare Other

## 2022-03-28 ENCOUNTER — Telehealth: Payer: Self-pay

## 2022-03-28 VITALS — Wt 199.0 lb

## 2022-03-28 DIAGNOSIS — Z0000 Encounter for general adult medical examination without abnormal findings: Secondary | ICD-10-CM

## 2022-03-28 NOTE — Addendum Note (Signed)
Addended by: Carlynn Herald E on: 03/28/2022 11:03 AM   Modules accepted: Level of Service

## 2022-03-28 NOTE — Progress Notes (Addendum)
Subjective:   Mark Walters is a 66 y.o. male who presents for an Initial Medicare Annual Wellness Visit.  Virtual Visit via Telephone Note  I connected with  [name] on 03/28/22 at  8:45 AM EDT by telephone and verified that I am speaking with the correct person using two identifiers.  Location: Patient: Home Provider: [facility] Persons participating in the virtual visit: patient/Nurse Health Advisor   I discussed the limitations, risks, security and privacy concerns of performing an evaluation and management service by telephone and the availability of in person appointments. The patient expressed understanding and agreed to proceed.  Interactive audio and video telecommunications were attempted between this nurse and patient, however failed, due to patient having technical difficulties OR patient did not have access to video capability.  We continued and completed visit with audio only.  Some vital signs may be absent or patient reported.   Ata Pecha E Jaheem Hedgepath, LPN   Review of Systems     Cardiac Risk Factors include: advanced age (>10men, >32 women);male gender     Objective:    Today's Vitals   03/28/22 0846  Weight: 199 lb (90.3 kg)   Body mass index is 27.75 kg/m.     03/28/2022    8:52 AM  Advanced Directives  Does Patient Have a Medical Advance Directive? No  Would patient like information on creating a medical advance directive? No - Patient declined    Current Medications (verified) Outpatient Encounter Medications as of 03/28/2022  Medication Sig   Ascorbic Acid (VITAMIN C) 1000 MG tablet Take 1,000 mg by mouth daily.   Cholecalciferol (VITAMIN D3) 50 MCG (2000 UT) TABS Take by mouth.   glucosamine-chondroitin 500-400 MG tablet Take 2 tablets by mouth daily.   No facility-administered encounter medications on file as of 03/28/2022.    Allergies (verified) Patient has no known allergies.   History: Past Medical History:  Diagnosis  Date   Arthritis    Past Surgical History:  Procedure Laterality Date   CATARACT EXTRACTION, BILATERAL     RETINAL DETACHMENT SURGERY Left    RETINAL LASER PROCEDURE Left    TOTAL HIP ARTHROPLASTY Left 2012   TOTAL HIP ARTHROPLASTY Right 2013   WISDOM TOOTH EXTRACTION     Family History  Problem Relation Age of Onset   Heart disease Paternal Grandfather    Hyperlipidemia Paternal Grandfather    Hypertension Paternal Grandfather    Alzheimer's disease Mother    Asthma Father    Asthma Sister    Social History   Socioeconomic History   Marital status: Married    Spouse name: Bonita Quin   Number of children: 2   Years of education: Not on file   Highest education level: Not on file  Occupational History   Occupation: retired  Tobacco Use   Smoking status: Former    Types: Cigars   Smokeless tobacco: Former  Substance and Sexual Activity   Alcohol use: Yes    Comment: occasional, EOD   Drug use: Never   Sexual activity: Not on file  Other Topics Concern   Not on file  Social History Narrative   Married.    03/2022 - his wife is currently residing in their [place] in [place] - he visits once per month   2 children. 1 grandchild.   Works in [employer] as a Production designer, theatre/television/film.    Enjoys playing chess, playing tennis, walking his dog.   Social Determinants of Health  Financial Resource Strain: Low Risk  (03/28/2022)   Overall Financial Resource Strain (CARDIA)    Difficulty of Paying Living Expenses: Not hard at all  Food Insecurity: No Food Insecurity (03/28/2022)   Hunger Vital Sign    Worried About Running Out of Food in the Last Year: Never true    Ran Out of Food in the Last Year: Never true  Transportation Needs: No Transportation Needs (03/28/2022)   PRAPARE - Administrator, Civil Service (Medical): No    Lack of Transportation (Non-Medical): No  Physical Activity: Sufficiently Active (03/28/2022)   Exercise Vital Sign    Days of Exercise per Week: 4 days     Minutes of Exercise per Session: 60 min  Stress: No Stress Concern Present (03/28/2022)   [employer] of Occupational Health - Occupational Stress Questionnaire    Feeling of Stress : Not at all  Social Connections: Moderately Integrated (03/28/2022)   Social Connection and Isolation Panel [NHANES]    Frequency of Communication with Friends and Family: More than three times a week    Frequency of Social Gatherings with Friends and Family: More than three times a week    Attends Religious Services: Never    Database administrator or Organizations: Yes    Attends Engineer, structural: More than 4 times per year    Marital Status: Married    Tobacco Counseling Counseling given: Not Answered   Clinical Intake:  Pre-visit preparation completed: Yes  Pain : No/denies pain     BMI - recorded: 27.75 Nutritional Status: BMI 25 -29 Overweight Nutritional Risks: None Diabetes: No  How often do you need to have someone help you when you read instructions, pamphlets, or other written materials from your doctor or pharmacy?: 1 - Never  Diabetic? no  Interpreter Needed?: No  Information entered by :: Jazari Ober, LPN   Activities of Daily Living    03/28/2022    8:52 AM  In your present state of health, do you have any difficulty performing the following activities:  Hearing? 0  Vision? 0  Difficulty concentrating or making decisions? 0  Walking or climbing stairs? 1  Comment hurts knees  Dressing or bathing? 0  Doing errands, shopping? 0  Preparing Food and eating ? N  Using the Toilet? N  In the past six months, have you accidently leaked urine? N  Do you have problems with loss of bowel control? N  Managing your Medications? N  Managing your Finances? N  Housekeeping or managing your Housekeeping? N    Patient Care Team: Myrlene Broker, MD as PCP - General (Internal Medicine)  Indicate any recent Medical Services you may have received from other  than [facility] providers in the past year (date may be approximate).     Assessment:   This is a routine wellness examination for Mark Walters.  Hearing/Vision screen Hearing Screening - Comments:: Denies hearing difficulties   Vision Screening - Comments:: Wears reading glasses prn - up to date with routine eye exams with Dr Alvester Morin in [place]  Dietary issues and exercise activities discussed: Current Exercise Habits: Home exercise routine, Type of exercise: walking;Other - see comments;stretching (tennis), Time (Minutes): 60, Frequency (Times/Week): 4, Weekly Exercise (Minutes/Week): 240, Intensity: Mild, Exercise limited by: orthopedic condition(s)   Goals Addressed             This Visit's Progress    Weight (lb) < 180 lb (81.6 kg)   199 lb (90.3 kg)  Work on losing weight, getting knees better, stay active, continue being able to pay all bills       Depression Screen    03/28/2022    8:51 AM 03/27/2022    9:47 AM 09/23/2020    2:52 PM  PHQ 2/9 Scores  PHQ - 2 Score 0 0 0    Fall Risk    03/28/2022    8:48 AM 03/27/2022    9:40 AM 09/23/2020    2:52 PM  Fall Risk   Falls in the past year? 0 0 0  Number falls in past yr: 0 0   Injury with Fall? 0 0   Risk for fall due to : Orthopedic patient No Fall Risks   Follow up Falls prevention discussed Falls evaluation completed     FALL RISK PREVENTION PERTAINING TO THE HOME:  Any stairs in or around the home? Yes  If so, are there any without handrails? No  Home free of loose throw rugs in walkways, pet beds, electrical cords, etc? Yes  Adequate lighting in your home to reduce risk of falls? Yes   ASSISTIVE DEVICES UTILIZED TO PREVENT FALLS:  Life alert? No  Use of a cane, walker or w/c? No  Grab bars in the bathroom? No  Shower chair or bench in shower? No  Elevated toilet seat or a handicapped toilet? No   TIMED UP AND GO:  Was the test performed? No . Telephonic visit  Cognitive Function:        03/28/2022     8:54 AM  6CIT Screen  What Year? 0 points  What month? 0 points  What time? 0 points  Count back from 20 0 points  Months in reverse 0 points  Repeat phrase 0 points  Total Score 0 points    Immunizations Immunization History  Administered Date(s) Administered   Janssen (J&J) SARS-COV-2 Vaccination 11/12/2019   PFIZER(Purple Top)SARS-COV-2 Vaccination 07/21/2020   PNEUMOCOCCAL CONJUGATE-20 03/27/2022    TDAP status: Due, Education has been provided regarding the importance of this vaccine. Advised may receive this vaccine at local pharmacy or Health Dept. Aware to provide a copy of the vaccination record if obtained from local pharmacy or Health Dept. Verbalized acceptance and understanding.  Flu Vaccine status: Declined, Education has been provided regarding the importance of this vaccine but patient still declined. Advised may receive this vaccine at local pharmacy or Health Dept. Aware to provide a copy of the vaccination record if obtained from local pharmacy or Health Dept. Verbalized acceptance and understanding.  Pneumococcal vaccine status: Up to date  Covid-19 vaccine status: Completed vaccines  Qualifies for Shingles Vaccine? Yes   Zostavax completed Yes   Shingrix Completed?: No.    Education has been provided regarding the importance of this vaccine. Patient has been advised to call insurance company to determine out of pocket expense if they have not yet received this vaccine. Advised may also receive vaccine at local pharmacy or Health Dept. Verbalized acceptance and understanding.  Screening Tests Health Maintenance  Topic Date Due   TETANUS/TDAP  Never done   Fecal DNA (Cologuard)  Never done   Zoster Vaccines- Shingrix (1 of 2) Never done   COVID-19 Vaccine (3 - Booster for Janssen series) 09/15/2020   INFLUENZA VACCINE  03/07/2022   Pneumonia Vaccine 44+ Years old  Completed   Hepatitis C Screening  Completed   HPV VACCINES  Aged Out    Health  Maintenance  Health Maintenance Due  Topic Date  Due   TETANUS/TDAP  Never done   Fecal DNA (Cologuard)  Never done   Zoster Vaccines- Shingrix (1 of 2) Never done   COVID-19 Vaccine (3 - Booster for Janssen series) 09/15/2020   INFLUENZA VACCINE  03/07/2022    Colorectal cancer screening: Type of screening: Cologuard. Completed 09/2020. Repeat every 3 years  Lung Cancer Screening: (Low Dose CT Chest recommended if Age 53-80 years, 30 pack-year currently smoking OR have quit w/in 15years.) does not qualify.   Additional Screening:  Hepatitis C Screening: does qualify; Completed 09/23/2020  Vision Screening: Recommended annual ophthalmology exams for early detection of glaucoma and other disorders of the eye. Is the patient up to date with their annual eye exam?  Yes  Who is the provider or what is the name of the office in which the patient attends annual eye exams? [facility] in [place] If pt is not established with a provider, would they like to be referred to a provider to establish care? No .   Dental Screening: Recommended annual dental exams for proper oral hygiene  Community Resource Referral / Chronic Care Management: CRR required this visit?  No   CCM required this visit?  No      Plan:     I have personally reviewed and noted the following in the patient's chart:   Medical and social history Use of alcohol, tobacco or illicit drugs  Current medications and supplements including opioid prescriptions. Patient is not currently taking opioid prescriptions. Functional ability and status Nutritional status Physical activity Advanced directives List of other physicians Hospitalizations, surgeries, and ER visits in previous 12 months Vitals Screenings to include cognitive, depression, and falls Referrals and appointments  In addition, I have reviewed and discussed with patient certain preventive protocols, quality metrics, and best practice recommendations. A written  personalized care plan for preventive services as well as general preventive health recommendations were provided to patient.     [place] Constable, LPN   9/50/[id number]   Nurse Notes: MD already performed AWV at visit yesterday, so disregard this note - Do not send to insurance for billing.

## 2022-03-28 NOTE — Patient Instructions (Signed)
Mark Walters , Thank you for taking time to come for your Medicare Wellness Visit. I appreciate your ongoing commitment to your health goals. Please review the following plan we discussed and let me know if I can assist you in the future.   Screening recommendations/referrals: Colonoscopy: Cologuard done 09/2020 - Repeat in 3 years   Recommended yearly ophthalmology/optometry visit for glaucoma screening and checkup Recommended yearly dental visit for hygiene and checkup  Vaccinations: Influenza vaccine: Declined - recommended annually in fall Pneumococcal vaccine: Done 03/27/2022  Tdap vaccine: Due - recommended every 10 years Shingles vaccine: Due - Shingrix is 2 doses 2-6 months apart and over 90% effective     Covid-19: Done Janssen 11/12/2019 & Pfizer 07/21/2020  Advanced directives: Advance directive discussed with you today. Even though you declined this today, please call our office should you change your mind, and we can give you the proper paperwork for you to fill out.   Conditions/risks identified: Keep up the great work! Aim for 30 minutes of exercise or brisk walking, 6-8 glasses of water, and 5 servings of fruits and vegetables each day.   Next appointment: Follow up in one year for your annual wellness visit.   Preventive Care 56 Years and Older, Male  Preventive care refers to lifestyle choices and visits with your health care provider that can promote health and wellness. What does preventive care include? A yearly physical exam. This is also called an annual well check. Dental exams once or twice a year. Routine eye exams. Ask your health care provider how often you should have your eyes checked. Personal lifestyle choices, including: Daily care of your teeth and gums. Regular physical activity. Eating a healthy diet. Avoiding tobacco and drug use. Limiting alcohol use. Practicing safe sex. Taking low doses of aspirin every day. Taking vitamin and mineral  supplements as recommended by your health care provider. What happens during an annual well check? The services and screenings done by your health care provider during your annual well check will depend on your age, overall health, lifestyle risk factors, and family history of disease. Counseling  Your health care provider may ask you questions about your: Alcohol use. Tobacco use. Drug use. Emotional well-being. Home and relationship well-being. Sexual activity. Eating habits. History of falls. Memory and ability to understand (cognition). Work and work Astronomer. Screening  You may have the following tests or measurements: Height, weight, and BMI. Blood pressure. Lipid and cholesterol levels. These may be checked every 5 years, or more frequently if you are over 48 years old. Skin check. Lung cancer screening. You may have this screening every year starting at age 32 if you have a 30-pack-year history of smoking and currently smoke or have quit within the past 15 years. Fecal occult blood test (FOBT) of the stool. You may have this test every year starting at age 76. Flexible sigmoidoscopy or colonoscopy. You may have a sigmoidoscopy every 5 years or a colonoscopy every 10 years starting at age 17. Prostate cancer screening. Recommendations will vary depending on your family history and other risks. Hepatitis C blood test. Hepatitis B blood test. Sexually transmitted disease (STD) testing. Diabetes screening. This is done by checking your blood sugar (glucose) after you have not eaten for a while (fasting). You may have this done every 1-3 years. Abdominal aortic aneurysm (AAA) screening. You may need this if you are a current or former smoker. Osteoporosis. You may be screened starting at age 2 if you are at  high risk. Talk with your health care provider about your test results, treatment options, and if necessary, the need for more tests. Vaccines  Your health care provider  may recommend certain vaccines, such as: Influenza vaccine. This is recommended every year. Tetanus, diphtheria, and acellular pertussis (Tdap, Td) vaccine. You may need a Td booster every 10 years. Zoster vaccine. You may need this after age 27. Pneumococcal 13-valent conjugate (PCV13) vaccine. One dose is recommended after age 16. Pneumococcal polysaccharide (PPSV23) vaccine. One dose is recommended after age 28. Talk to your health care provider about which screenings and vaccines you need and how often you need them. This information is not intended to replace advice given to you by your health care provider. Make sure you discuss any questions you have with your health care provider. Document Released: 08/20/2015 Document Revised: 04/12/2016 Document Reviewed: 05/25/2015 Elsevier Interactive Patient Education  2017 [place] Prevention in the Home Falls can cause injuries. They can happen to people of all ages. There are many things you can do to make your home safe and to help prevent falls. What can I do on the outside of my home? Regularly fix the edges of walkways and driveways and fix any cracks. Remove anything that might make you trip as you walk through a door, such as a raised step or threshold. Trim any bushes or trees on the path to your home. Use bright outdoor lighting. Clear any walking paths of anything that might make someone trip, such as rocks or tools. Regularly check to see if handrails are loose or broken. Make sure that both sides of any steps have handrails. Any raised decks and porches should have guardrails on the edges. Have any leaves, snow, or ice cleared regularly. Use sand or salt on walking paths during winter. Clean up any spills in your garage right away. This includes oil or grease spills. What can I do in the bathroom? Use night lights. Install grab bars by the toilet and in the tub and shower. Do not use towel bars as grab bars. Use  non-skid mats or decals in the tub or shower. If you need to sit down in the shower, use a plastic, non-slip stool. Keep the floor dry. Clean up any water that spills on the floor as soon as it happens. Remove soap buildup in the tub or shower regularly. Attach bath mats securely with double-sided non-slip rug tape. Do not have throw rugs and other things on the floor that can make you trip. What can I do in the bedroom? Use night lights. Make sure that you have a light by your bed that is easy to reach. Do not use any sheets or blankets that are too big for your bed. They should not hang down onto the floor. Have a firm chair that has side arms. You can use this for support while you get dressed. Do not have throw rugs and other things on the floor that can make you trip. What can I do in the kitchen? Clean up any spills right away. Avoid walking on wet floors. Keep items that you use a lot in easy-to-reach places. If you need to reach something above you, use a strong step stool that has a grab bar. Keep electrical cords out of the way. Do not use floor polish or wax that makes floors slippery. If you must use wax, use non-skid floor wax. Do not have throw rugs and other things on the floor that  can make you trip. What can I do with my stairs? Do not leave any items on the stairs. Make sure that there are handrails on both sides of the stairs and use them. Fix handrails that are broken or loose. Make sure that handrails are as long as the stairways. Check any carpeting to make sure that it is firmly attached to the stairs. Fix any carpet that is loose or worn. Avoid having throw rugs at the top or bottom of the stairs. If you do have throw rugs, attach them to the floor with carpet tape. Make sure that you have a light switch at the top of the stairs and the bottom of the stairs. If you do not have them, ask someone to add them for you. What else can I do to help prevent falls? Wear  shoes that: Do not have high heels. Have rubber bottoms. Are comfortable and fit you well. Are closed at the toe. Do not wear sandals. If you use a stepladder: Make sure that it is fully opened. Do not climb a closed stepladder. Make sure that both sides of the stepladder are locked into place. Ask someone to hold it for you, if possible. Clearly mark and make sure that you can see: Any grab bars or handrails. First and last steps. Where the edge of each step is. Use tools that help you move around (mobility aids) if they are needed. These include: Canes. Walkers. Scooters. Crutches. Turn on the lights when you go into a dark area. Replace any light bulbs as soon as they burn out. Set up your furniture so you have a clear path. Avoid moving your furniture around. If any of your floors are uneven, fix them. If there are any pets around you, be aware of where they are. Review your medicines with your doctor. Some medicines can make you feel dizzy. This can increase your chance of falling. Ask your doctor what other things that you can do to help prevent falls. This information is not intended to replace advice given to you by your health care provider. Make sure you discuss any questions you have with your health care provider. Document Released: 05/20/2009 Document Revised: 12/30/2015 Document Reviewed: 08/28/2014 Elsevier Interactive Patient Education  2017 [employer].

## 2022-03-28 NOTE — Telephone Encounter (Signed)
He was given 6 exercises for his neck at last office visit - How many reps and times per day should he do this?

## 2022-03-28 NOTE — Telephone Encounter (Signed)
I would pick 3 exercises and do 5 reps twice a day.

## 2022-11-16 DIAGNOSIS — K080 Exfoliation of teeth due to systemic causes: Secondary | ICD-10-CM | POA: Diagnosis not present

## 2022-11-27 DIAGNOSIS — K080 Exfoliation of teeth due to systemic causes: Secondary | ICD-10-CM | POA: Diagnosis not present

## 2022-12-25 DIAGNOSIS — H524 Presbyopia: Secondary | ICD-10-CM | POA: Diagnosis not present

## 2022-12-25 DIAGNOSIS — Z961 Presence of intraocular lens: Secondary | ICD-10-CM | POA: Diagnosis not present

## 2023-02-15 DIAGNOSIS — H26491 Other secondary cataract, right eye: Secondary | ICD-10-CM | POA: Diagnosis not present

## 2023-02-15 DIAGNOSIS — H26492 Other secondary cataract, left eye: Secondary | ICD-10-CM | POA: Diagnosis not present

## 2023-02-15 DIAGNOSIS — H18413 Arcus senilis, bilateral: Secondary | ICD-10-CM | POA: Diagnosis not present

## 2023-02-15 DIAGNOSIS — Z961 Presence of intraocular lens: Secondary | ICD-10-CM | POA: Diagnosis not present

## 2023-02-15 DIAGNOSIS — H02831 Dermatochalasis of right upper eyelid: Secondary | ICD-10-CM | POA: Diagnosis not present

## 2023-05-29 DIAGNOSIS — K080 Exfoliation of teeth due to systemic causes: Secondary | ICD-10-CM | POA: Diagnosis not present

## 2023-09-06 ENCOUNTER — Ambulatory Visit (INDEPENDENT_AMBULATORY_CARE_PROVIDER_SITE_OTHER): Payer: Medicare Other | Admitting: Internal Medicine

## 2023-09-06 ENCOUNTER — Encounter: Payer: Self-pay | Admitting: Internal Medicine

## 2023-09-06 VITALS — BP 128/80 | HR 44 | Temp 98.1°F | Ht 71.0 in | Wt 199.0 lb

## 2023-09-06 DIAGNOSIS — B029 Zoster without complications: Secondary | ICD-10-CM

## 2023-09-06 DIAGNOSIS — Z1211 Encounter for screening for malignant neoplasm of colon: Secondary | ICD-10-CM

## 2023-09-06 MED ORDER — VALACYCLOVIR HCL 1 G PO TABS: 1000.0000 mg | ORAL_TABLET | Freq: Three times a day (TID) | ORAL | 0 refills | Status: AC

## 2023-09-06 NOTE — Progress Notes (Signed)
   Subjective:   Patient ID: Mark Walters, male    DOB: [birth year], 68 y.o.   MRN: [id number]  HPI The patient is a 68 YO man coming in for rash on neck and left shoulder. Mildly tingling but not badly painful. Has not taken otc for it.   Review of Systems  Constitutional: Negative.   HENT: Negative.    Eyes: Negative.   Respiratory:  Negative for cough, chest tightness and shortness of breath.   Cardiovascular:  Negative for chest pain, palpitations and leg swelling.  Gastrointestinal:  Negative for abdominal distention, abdominal pain, constipation, diarrhea, nausea and vomiting.  Musculoskeletal: Negative.   Skin:  Positive for rash.  Neurological: Negative.   Psychiatric/Behavioral: Negative.      Objective:  Physical Exam Constitutional:      Appearance: He is well-developed.  HENT:     Head: Normocephalic and atraumatic.  Cardiovascular:     Rate and Rhythm: Normal rate and regular rhythm.  Pulmonary:     Effort: Pulmonary effort is normal. No respiratory distress.     Breath sounds: Normal breath sounds. No wheezing or rales.  Abdominal:     General: Bowel sounds are normal. There is no distension.     Palpations: Abdomen is soft.     Tenderness: There is no abdominal tenderness. There is no rebound.  Musculoskeletal:     Cervical back: Normal range of motion.  Skin:    General: Skin is warm and dry.     Comments: Rash consistent with shingles left neck and upper shoulder region  Neurological:     Mental Status: He is alert and oriented to person, place, and time.     Coordination: Coordination normal.     Vitals:   09/06/23 1027  BP: 128/80  Pulse: (!) 44  Temp: 98.1 F (36.7 C)  TempSrc: Oral  SpO2: 94%  Weight: 199 lb (90.3 kg)  Height: 5\' 11"  (1.803 m)    Assessment & Plan:

## 2023-09-06 NOTE — Patient Instructions (Signed)
We will send in the valtrex to take 1 pill 3 times a day for 1 week.

## 2023-09-07 DIAGNOSIS — B029 Zoster without complications: Secondary | ICD-10-CM | POA: Insufficient documentation

## 2023-09-07 NOTE — Assessment & Plan Note (Signed)
Rx valtrex 1 gm TID. Counseled he will need to wait 1 year from resolution then shingrix is recommended.

## 2023-10-10 ENCOUNTER — Encounter: Payer: Self-pay | Admitting: Internal Medicine

## 2023-10-10 ENCOUNTER — Ambulatory Visit (INDEPENDENT_AMBULATORY_CARE_PROVIDER_SITE_OTHER): Payer: Medicare Other

## 2023-10-10 ENCOUNTER — Ambulatory Visit: Payer: Medicare Other | Admitting: Internal Medicine

## 2023-10-10 VITALS — BP 124/80 | HR 61 | Temp 97.5°F | Ht 71.0 in | Wt 198.0 lb

## 2023-10-10 VITALS — Ht 71.0 in | Wt 199.0 lb

## 2023-10-10 DIAGNOSIS — Z1211 Encounter for screening for malignant neoplasm of colon: Secondary | ICD-10-CM | POA: Diagnosis not present

## 2023-10-10 DIAGNOSIS — M1990 Unspecified osteoarthritis, unspecified site: Secondary | ICD-10-CM | POA: Diagnosis not present

## 2023-10-10 DIAGNOSIS — Z0000 Encounter for general adult medical examination without abnormal findings: Secondary | ICD-10-CM

## 2023-10-10 DIAGNOSIS — Z125 Encounter for screening for malignant neoplasm of prostate: Secondary | ICD-10-CM | POA: Diagnosis not present

## 2023-10-10 DIAGNOSIS — Z13220 Encounter for screening for lipoid disorders: Secondary | ICD-10-CM

## 2023-10-10 DIAGNOSIS — B029 Zoster without complications: Secondary | ICD-10-CM

## 2023-10-10 LAB — COMPREHENSIVE METABOLIC PANEL
ALT: 22 U/L (ref 0–53)
AST: 19 U/L (ref 0–37)
Albumin: 4.1 g/dL (ref 3.5–5.2)
Alkaline Phosphatase: 46 U/L (ref 39–117)
BUN: 17 mg/dL (ref 6–23)
CO2: 30 meq/L (ref 19–32)
Calcium: 9.5 mg/dL (ref 8.4–10.5)
Chloride: 100 meq/L (ref 96–112)
Creatinine, Ser: 0.92 mg/dL (ref 0.40–1.50)
GFR: 85.72 mL/min (ref >60.00)
Glucose, Bld: 105 mg/dL — ABNORMAL HIGH (ref 70–99)
Potassium: 4.8 meq/L (ref 3.5–5.1)
Sodium: 136 meq/L (ref 135–145)
Total Bilirubin: 0.5 mg/dL (ref 0.2–1.2)
Total Protein: 6.7 g/dL (ref 6.0–8.3)

## 2023-10-10 LAB — CBC
HCT: 41.3 % (ref 39.0–52.0)
Hemoglobin: 14 g/dL (ref 13.0–17.0)
MCHC: 33.8 g/dL (ref 30.0–36.0)
MCV: 98.9 fl (ref 78.0–100.0)
Platelets: 267 10*3/uL (ref 150.0–400.0)
RBC: 4.18 Mil/uL — ABNORMAL LOW (ref 4.22–5.81)
RDW: 12.7 % (ref 11.5–15.5)
WBC: 5.3 10*3/uL (ref 4.0–10.5)

## 2023-10-10 LAB — LIPID PANEL
Cholesterol: 206 mg/dL — ABNORMAL HIGH (ref 0–200)
HDL: 57.9 mg/dL (ref >39.00)
LDL Cholesterol: 125 mg/dL — ABNORMAL HIGH (ref 0–99)
NonHDL: 148.03
Total CHOL/HDL Ratio: 4
Triglycerides: 113 mg/dL (ref 0.0–149.0)
VLDL: 22.6 mg/dL (ref 0.0–40.0)

## 2023-10-10 LAB — PSA: PSA: 2.46 ng/mL (ref 0.10–4.00)

## 2023-10-10 NOTE — Progress Notes (Signed)
 Subjective:   Mark Walters is a 68 y.o. who presents for a Medicare Wellness preventive visit.  Visit Complete: Virtual I connected with  Mark Walters on 10/10/23 by a audio enabled telemedicine application and verified that I am speaking with the correct person using two identifiers.  Patient Location: Home  Provider Location: Office/Clinic  I discussed the limitations of evaluation and management by telemedicine. The patient expressed understanding and agreed to proceed.  Vital Signs: Because this visit was a virtual/telehealth visit, some criteria may be missing or patient reported. Any vitals not documented were not able to be obtained and vitals that have been documented are patient reported.  VideoDeclined- This patient declined Librarian, academic. Therefore the visit was completed with audio only.  AWV Questionnaire: No: Patient Medicare AWV questionnaire was not completed prior to this visit.  Cardiac Risk Factors include: advanced age (>75men, >57 women)     Objective:    Today's Vitals   10/10/23 0755  Weight: 199 lb (90.3 kg)  Height: 5\' 11"  (1.803 m)   Body mass index is 27.75 kg/m.     10/10/2023    7:52 AM 03/28/2022    8:52 AM  Advanced Directives  Does Patient Have a Medical Advance Directive? No No  Would patient like information on creating a medical advance directive? Yes (MAU/Ambulatory/Procedural Areas - Information given) No - Patient declined    Current Medications (verified) Outpatient Encounter Medications as of 10/10/2023  Medication Sig   Ascorbic Acid (VITAMIN C) 1000 MG tablet Take 1,000 mg by mouth daily.   Cholecalciferol (VITAMIN D3) 50 MCG (2000 UT) TABS Take by mouth.   glucosamine-chondroitin 500-400 MG tablet Take 2 tablets by mouth daily.   No facility-administered encounter medications on file as of 10/10/2023.    Allergies (verified) Patient has no known allergies.   History: Past Medical  History:  Diagnosis Date   Arthritis    Past Surgical History:  Procedure Laterality Date   CATARACT EXTRACTION, BILATERAL     RETINAL DETACHMENT SURGERY Left    RETINAL LASER PROCEDURE Left    TOTAL HIP ARTHROPLASTY Left 2012   TOTAL HIP ARTHROPLASTY Right 2013   WISDOM TOOTH EXTRACTION     Family History  Problem Relation Age of Onset   Heart disease Paternal Grandfather    Hyperlipidemia Paternal Grandfather    Hypertension Paternal Grandfather    Alzheimer's disease Mother    Asthma Father    Asthma Sister    Social History   Socioeconomic History   Marital status: Married    Spouse name: Bonita Quin   Number of children: 2   Years of education: Not on file   Highest education level: Bachelor's degree (e.g., BA, AB, BS)  Occupational History   Occupation: retired  Tobacco Use   Smoking status: Former    Types: Cigars   Smokeless tobacco: Former  Substance and Sexual Activity   Alcohol use: Yes    Alcohol/week: 1.0 standard drink of alcohol    Types: 1 Cans of beer per week    Comment: occasionally; 3x a week   Drug use: Never   Sexual activity: Not on file  Other Topics Concern   Not on file  Social History Narrative   Married.    03/2022 - his wife is currently residing in their [place] in [place] - he visits once per month   2 children. 1 grandchild.   Works in [employer] as a Production designer, theatre/television/film.  Enjoys playing chess, playing tennis, walking his dog.   Social Drivers of Corporate investment banker Strain: Low Risk  (10/10/2023)   Overall Financial Resource Strain (CARDIA)    Difficulty of Paying Living Expenses: Not hard at all  Food Insecurity: No Food Insecurity (10/10/2023)   Hunger Vital Sign    Worried About Running Out of Food in the Last Year: Never true    Ran Out of Food in the Last Year: Never true  Transportation Needs: No Transportation Needs (10/10/2023)   PRAPARE - Administrator, Civil Service (Medical): No    Lack of Transportation  (Non-Medical): No  Physical Activity: Insufficiently Active (10/10/2023)   Exercise Vital Sign    Days of Exercise per Week: 3 days    Minutes of Exercise per Session: 30 min  Stress: No Stress Concern Present (10/10/2023)   [employer] of Occupational Health - Occupational Stress Questionnaire    Feeling of Stress : Not at all  Social Connections: Moderately Isolated (10/10/2023)   Social Connection and Isolation Panel [NHANES]    Frequency of Communication with Friends and Family: More than three times a week    Frequency of Social Gatherings with Friends and Family: More than three times a week    Attends Religious Services: Never    Database administrator or Organizations: No    Attends Engineer, structural: Never    Marital Status: Married    Tobacco Counseling Counseling given: Not Answered    Clinical Intake:  Pre-visit preparation completed: Yes  Pain : No/denies pain     BMI - recorded: 27.75 Nutritional Status: BMI 25 -29 Overweight Nutritional Risks: None Diabetes: No  How often do you need to have someone help you when you read instructions, pamphlets, or other written materials from your doctor or pharmacy?: 1 - Never  Interpreter Needed?: No  Information entered by :: Hassell Halim, CMA   Activities of Daily Living     10/10/2023    7:57 AM  In your present state of health, do you have any difficulty performing the following activities:  Hearing? 0  Vision? 0  Difficulty concentrating or making decisions? 0  Walking or climbing stairs? 0  Dressing or bathing? 0  Doing errands, shopping? 0  Preparing Food and eating ? N  Using the Toilet? N  In the past six months, have you accidently leaked urine? N  Do you have problems with loss of bowel control? N  Managing your Medications? N  Managing your Finances? N  Housekeeping or managing your Housekeeping? N    Patient Care Team: Myrlene Broker, MD as PCP - General (Internal  Medicine)  Indicate any recent Medical Services you may have received from other than [facility] providers in the past year (date may be approximate).     Assessment:   This is a routine wellness examination for Mark Walters.  Hearing/Vision screen Hearing Screening - Comments:: Denies hearing difficulties   Vision Screening - Comments:: Sees Dr Alvester Morin - had lasik surgery   Goals Addressed               This Visit's Progress     Patient Stated (pt-stated)        Patient stated he plans to stay consistent with exercising and staying active.         Depression Screen     10/10/2023    8:00 AM 09/06/2023   10:30 AM 03/28/2022    8:51  AM 03/27/2022    9:47 AM 09/23/2020    2:52 PM  PHQ 2/9 Scores  PHQ - 2 Score 0 0 0 0 0  PHQ- 9 Score 0 0       Fall Risk     10/10/2023    8:01 AM 09/06/2023   10:30 AM 03/28/2022    8:48 AM 03/27/2022    9:40 AM 09/23/2020    2:52 PM  Fall Risk   Falls in the past year? 0 0 0 0 0  Number falls in past yr: 0 0 0 0   Injury with Fall? 0 0 0 0   Risk for fall due to : No Fall Risks  Orthopedic patient No Fall Risks   Follow up Falls prevention discussed;Falls evaluation completed Falls evaluation completed Falls prevention discussed Falls evaluation completed     MEDICARE RISK AT HOME:  Medicare Risk at Home Any stairs in or around the home?: Yes (outside) If so, are there any without handrails?: No Home free of loose throw rugs in walkways, pet beds, electrical cords, etc?: Yes Adequate lighting in your home to reduce risk of falls?: Yes Life alert?: No Use of a cane, walker or w/c?: No Grab bars in the bathroom?: No Shower chair or bench in shower?: No Elevated toilet seat or a handicapped toilet?: Yes  TIMED UP AND GO:  Was the test performed?  No  Cognitive Function: 6CIT completed        10/10/2023    8:03 AM 03/28/2022    8:54 AM  6CIT Screen  What Year? 0 points 0 points  What month? 0 points 0 points  What time? 0 points 0 points   Count back from 20 0 points 0 points  Months in reverse 0 points 0 points  Repeat phrase 2 points 0 points  Total Score 2 points 0 points    Immunizations Immunization History  Administered Date(s) Administered   Janssen (J&J) SARS-COV-2 Vaccination 11/12/2019   PFIZER(Purple Top)SARS-COV-2 Vaccination 07/21/2020   PNEUMOCOCCAL CONJUGATE-20 03/27/2022    Screening Tests Health Maintenance  Topic Date Due   DTaP/Tdap/Td (1 - Tdap) Never done   Zoster Vaccines- Shingrix (1 of 2) Never done   COVID-19 Vaccine (3 - 2024-25 season) 04/08/2023   INFLUENZA VACCINE  11/05/2023 (Originally 03/08/2023)   Fecal DNA (Cologuard)  10/26/2023   Medicare Annual Wellness (AWV)  10/09/2024   Pneumonia Vaccine 11+ Years old  Completed   Hepatitis C Screening  Completed   HPV VACCINES  Aged Out    Health Maintenance  Health Maintenance Due  Topic Date Due   DTaP/Tdap/Td (1 - Tdap) Never done   Zoster Vaccines- Shingrix (1 of 2) Never done   COVID-19 Vaccine (3 - 2024-25 season) 04/08/2023   Health Maintenance Items Addressed: Cologuard Ordered on 10/10/23.  Shingles status: Pt stated was advised by PCP to hold off on getting the Shingles vaccine for 1 add'tl year.  Additional Screening:  Vision Screening: Recommended annual ophthalmology exams for early detection of glaucoma and other disorders of the eye. Pt stated has annual eye exam w/Dr Alvester Morin.  Dental Screening: Recommended annual dental exams for proper oral hygiene  Community Resource Referral / Chronic Care Management: CRR required this visit?  No   CCM required this visit?  No     Plan:     I have personally reviewed and noted the following in the patient's chart:   Medical and social history Use of alcohol, tobacco or illicit  drugs  Current medications and supplements including opioid prescriptions. Patient is not currently taking opioid prescriptions. Functional ability and status Nutritional status Physical  activity Advanced directives List of other physicians Hospitalizations, surgeries, and ER visits in previous 12 months Vitals Screenings to include cognitive, depression, and falls Referrals and appointments  In addition, I have reviewed and discussed with patient certain preventive protocols, quality metrics, and best practice recommendations. A written personalized care plan for preventive services as well as general preventive health recommendations were provided to patient.     Darreld Mclean, CMA   10/10/2023   After Visit Summary: (MyChart) Due to this being a telephonic visit, the after visit summary with patients personalized plan was offered to patient via MyChart   Notes:  Educated and advised on getting the Tdap vaccine at local pharmacy.

## 2023-10-10 NOTE — Assessment & Plan Note (Signed)
 Using otc and doing well. Active.

## 2023-10-10 NOTE — Progress Notes (Signed)
   Subjective:   Patient ID: Mark Walters, male    DOB: 28-Sep-1955, 67 y.o.   MRN: [id number]  HPI The patient is here for physical.  PMH, [facility], social history reviewed and updated  Review of Systems  Constitutional: Negative.   HENT: Negative.    Eyes: Negative.   Respiratory:  Negative for cough, chest tightness and shortness of breath.   Cardiovascular:  Negative for chest pain, palpitations and leg swelling.  Gastrointestinal:  Negative for abdominal distention, abdominal pain, constipation, diarrhea, nausea and vomiting.  Musculoskeletal: Negative.   Skin: Negative.   Neurological: Negative.   Psychiatric/Behavioral: Negative.      Objective:  Physical Exam Constitutional:      Appearance: He is well-developed.  HENT:     Head: Normocephalic and atraumatic.  Cardiovascular:     Rate and Rhythm: Normal rate and regular rhythm.  Pulmonary:     Effort: Pulmonary effort is normal. No respiratory distress.     Breath sounds: Normal breath sounds. No wheezing or rales.  Abdominal:     General: Bowel sounds are normal. There is no distension.     Palpations: Abdomen is soft.     Tenderness: There is no abdominal tenderness. There is no rebound.  Musculoskeletal:     Cervical back: Normal range of motion.  Skin:    General: Skin is warm and dry.  Neurological:     Mental Status: He is alert and oriented to person, place, and time.     Coordination: Coordination normal.     Vitals:   10/10/23 0834  BP: 124/80  Pulse: 61  Temp: (!) 97.5 F (36.4 C)  TempSrc: Oral  SpO2: 96%  Weight: 198 lb (89.8 kg)  Height: 5\' 11"  (1.803 m)    Assessment & Plan:

## 2023-10-10 NOTE — Assessment & Plan Note (Signed)
 Flu shot up to date. Pneumonia complete. Shingrix counseled. Tetanus up to date. Colonoscopy due March will get cologuard then order already in place. Counseled about sun safety and mole surveillance. Counseled about the dangers of distracted driving. Given 10 year screening recommendations.

## 2023-10-10 NOTE — Patient Instructions (Addendum)
 Mark Walters , Thank you for taking time to come for your Medicare Wellness Visit. I appreciate your ongoing commitment to your health goals. Please review the following plan we discussed and let me know if I can assist you in the future.   Referrals/Orders/Follow-Ups/Clinician Recommendations: Aim for 30 minutes of exercise or brisk walking, 6-8 glasses of water, and 5 servings of fruits and vegetables each day. Educated and advised on getting the Tdap and Shingles vaccine at local pharmacy.  This is a list of the screening recommended for you and due dates:  Health Maintenance  Topic Date Due   DTaP/Tdap/Td vaccine (1 - Tdap) Never done   Zoster (Shingles) Vaccine (1 of 2) Never done   COVID-19 Vaccine (3 - 2024-25 season) 04/08/2023   Flu Shot  11/05/2023*   Cologuard (Stool DNA test)  10/26/2023   Medicare Annual Wellness Visit  10/09/2024   Pneumonia Vaccine  Completed   Hepatitis C Screening  Completed   HPV Vaccine  Aged Out  *Topic was postponed. The date shown is not the original due date.    Advanced directives: (Provided) Advance directive discussed with you today. I have provided a copy for you to complete at home and have notarized. Once this is complete, please bring a copy in to our office so we can scan it into your chart.   Next Medicare Annual Wellness Visit scheduled for next year: Yes - 2026

## 2023-10-10 NOTE — Assessment & Plan Note (Signed)
 Resolved well with treatment no residual. Understands that he cannot do shingrix until late Jan 2026.

## 2023-10-11 ENCOUNTER — Encounter: Payer: Self-pay | Admitting: Internal Medicine

## 2023-11-13 DIAGNOSIS — Z1211 Encounter for screening for malignant neoplasm of colon: Secondary | ICD-10-CM | POA: Diagnosis not present

## 2023-11-23 LAB — COLOGUARD: COLOGUARD: NEGATIVE

## 2023-11-28 ENCOUNTER — Encounter: Payer: Self-pay | Admitting: Internal Medicine

## 2023-11-28 LAB — COLOGUARD: Cologuard: NEGATIVE

## 2024-01-30 DIAGNOSIS — K080 Exfoliation of teeth due to systemic causes: Secondary | ICD-10-CM | POA: Diagnosis not present

## 2024-06-19 DIAGNOSIS — H524 Presbyopia: Secondary | ICD-10-CM | POA: Diagnosis not present

## 2024-06-19 DIAGNOSIS — Z961 Presence of intraocular lens: Secondary | ICD-10-CM | POA: Diagnosis not present

## 2024-10-13 ENCOUNTER — Ambulatory Visit

## 2024-10-13 ENCOUNTER — Encounter: Admitting: Internal Medicine
